# Patient Record
Sex: Male | Born: 1965 | Race: White | Hispanic: No | Marital: Married | Smoking: Never smoker
Health system: Southern US, Community
[De-identification: ages and names within clinical notes are randomized; demographics above are authoritative.]

## PROBLEM LIST (undated history)

## (undated) HISTORY — PX: TONSILLECTOMY: SUR1361

---

## 2013-03-04 ENCOUNTER — Other Ambulatory Visit: Payer: Self-pay | Admitting: Anesthesiology

## 2013-03-04 DIAGNOSIS — M545 Low back pain: Secondary | ICD-10-CM

## 2013-04-05 ENCOUNTER — Inpatient Hospital Stay: Admission: RE | Admit: 2013-04-05 | Payer: Self-pay | Source: Ambulatory Visit

## 2013-04-06 ENCOUNTER — Ambulatory Visit
Admission: RE | Admit: 2013-04-06 | Discharge: 2013-04-06 | Disposition: A | Discharge status: Home / Self Care | Payer: BC Managed Care – PPO | Source: Ambulatory Visit | Attending: Anesthesiology | Admitting: Anesthesiology

## 2013-04-06 DIAGNOSIS — M545 Low back pain: Secondary | ICD-10-CM

## 2014-01-02 ENCOUNTER — Other Ambulatory Visit: Payer: Self-pay | Admitting: Anesthesiology

## 2014-01-02 DIAGNOSIS — M542 Cervicalgia: Secondary | ICD-10-CM

## 2014-01-07 ENCOUNTER — Ambulatory Visit
Admission: RE | Admit: 2014-01-07 | Discharge: 2014-01-07 | Disposition: A | Discharge status: Home / Self Care | Payer: BC Managed Care – PPO | Source: Ambulatory Visit | Attending: Anesthesiology | Admitting: Anesthesiology

## 2014-01-07 DIAGNOSIS — M542 Cervicalgia: Secondary | ICD-10-CM

## 2017-01-03 ENCOUNTER — Other Ambulatory Visit: Payer: Self-pay | Admitting: Physical Medicine and Rehabilitation

## 2017-01-03 DIAGNOSIS — M5432 Sciatica, left side: Secondary | ICD-10-CM

## 2017-01-16 ENCOUNTER — Ambulatory Visit
Admission: RE | Admit: 2017-01-16 | Discharge: 2017-01-16 | Disposition: A | Discharge status: Home / Self Care | Payer: BLUE CROSS/BLUE SHIELD | Source: Ambulatory Visit | Attending: Physical Medicine and Rehabilitation | Admitting: Physical Medicine and Rehabilitation

## 2017-01-16 DIAGNOSIS — M5432 Sciatica, left side: Secondary | ICD-10-CM

## 2017-03-15 DIAGNOSIS — K921 Melena: Secondary | ICD-10-CM | POA: Diagnosis not present

## 2017-03-30 DIAGNOSIS — K921 Melena: Secondary | ICD-10-CM | POA: Diagnosis not present

## 2017-04-02 DIAGNOSIS — H00014 Hordeolum externum left upper eyelid: Secondary | ICD-10-CM | POA: Diagnosis not present

## 2017-06-20 DIAGNOSIS — Z0000 Encounter for general adult medical examination without abnormal findings: Secondary | ICD-10-CM | POA: Diagnosis not present

## 2017-06-20 DIAGNOSIS — E785 Hyperlipidemia, unspecified: Secondary | ICD-10-CM | POA: Diagnosis not present

## 2017-06-20 DIAGNOSIS — E1169 Type 2 diabetes mellitus with other specified complication: Secondary | ICD-10-CM | POA: Diagnosis not present

## 2017-06-20 DIAGNOSIS — Z2821 Immunization not carried out because of patient refusal: Secondary | ICD-10-CM | POA: Diagnosis not present

## 2017-06-20 DIAGNOSIS — E559 Vitamin D deficiency, unspecified: Secondary | ICD-10-CM | POA: Diagnosis not present

## 2017-07-13 IMAGING — MR MR LUMBAR SPINE W/O CM
4 of 5 series · 20 of 48 positions shown · non-contrast
Comparison: 04/06/2013

CLINICAL DATA: Chronic low back pain with worsening left-sided pain
and weakness extending to the foot over the last 2 months.

EXAM:
MRI LUMBAR SPINE WITHOUT CONTRAST
TECHNIQUE: Multiplanar, multisequence MR imaging of the lumbar spine was
performed. No intravenous contrast was administered.

[Series 6: T2 · sagittal · 4.0mm · 0.73mm/px · 6 of 15 slices shown (1 of 2)]
[im 1/15]
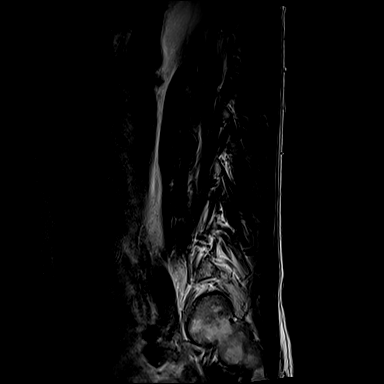
[im 3/15]
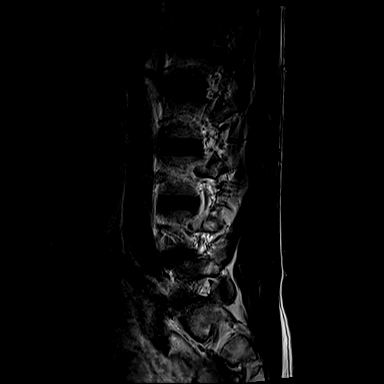
[im 6/15]
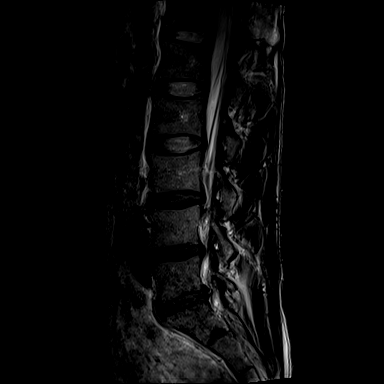
[im 9/15]
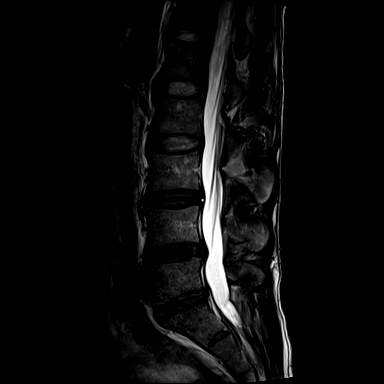
[im 12/15]
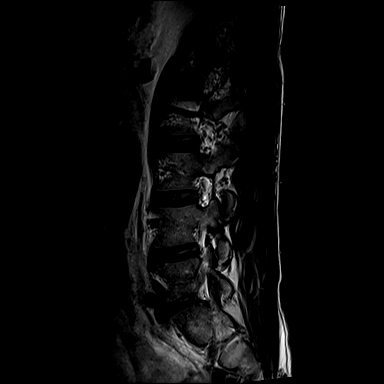
[im 15/15]
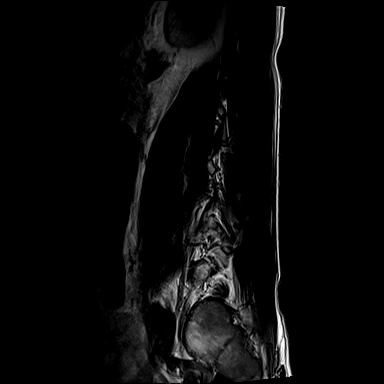

[Series 7: T1 · sagittal · 4.0mm · 0.73mm/px · 3 of 15 slices shown]
[im 3/15]
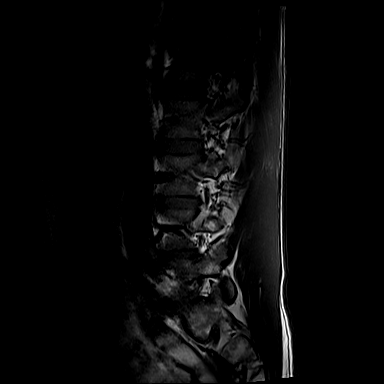
[im 9/15]
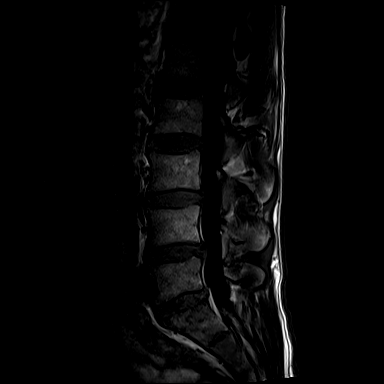
[im 15/15]
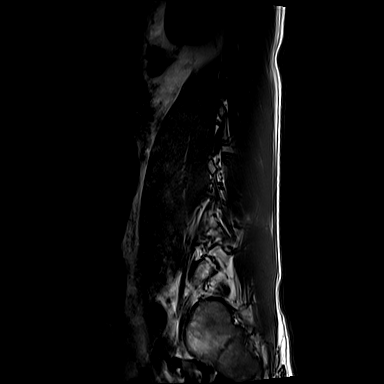

[Series 8: STIR · sagittal · 4.0mm · 0.88mm/px · 3 of 15 slices shown]
[im 3/15]
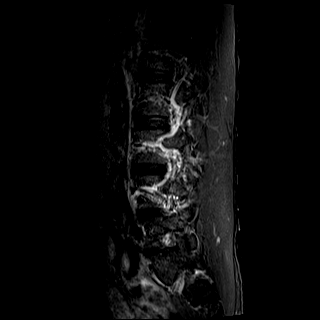
[im 9/15]
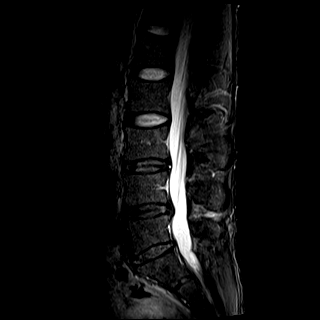
[im 15/15]
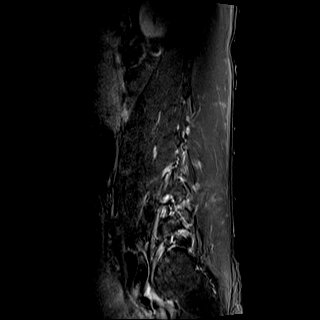

[Series 13: T2 · axial · 4.0mm · 0.28mm/px · z∈[-128,+45]mm · 8 of 38 slices shown (2 of 2)]
[im 1/38]
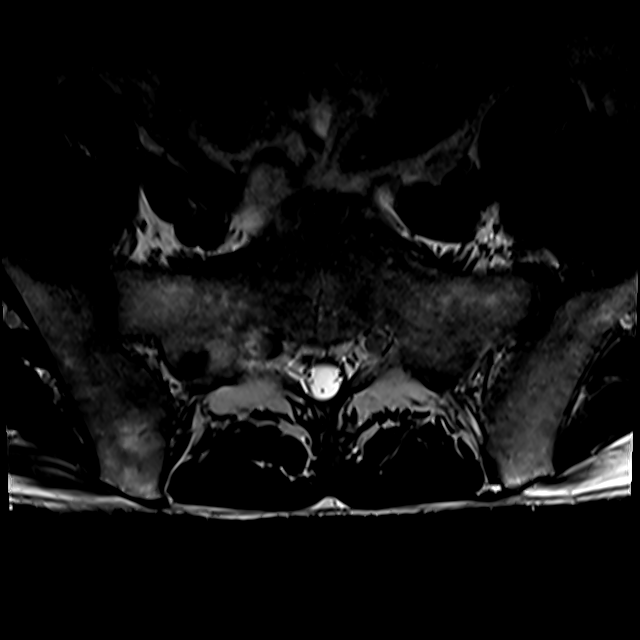
[im 6/38]
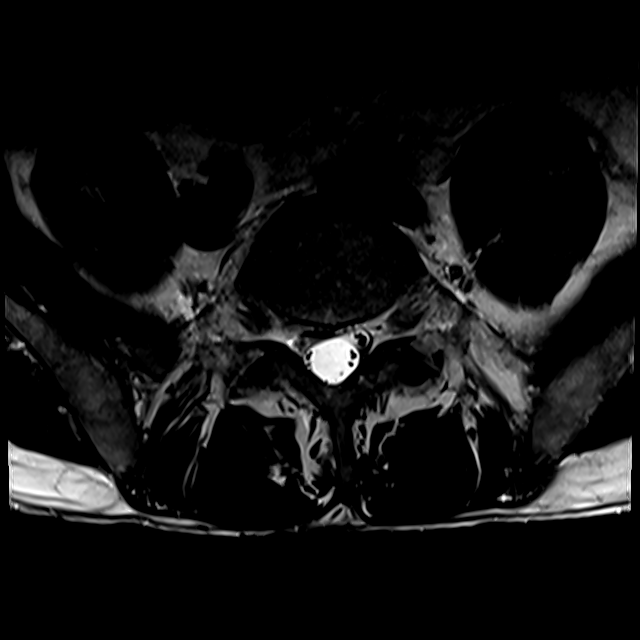
[im 11/38]
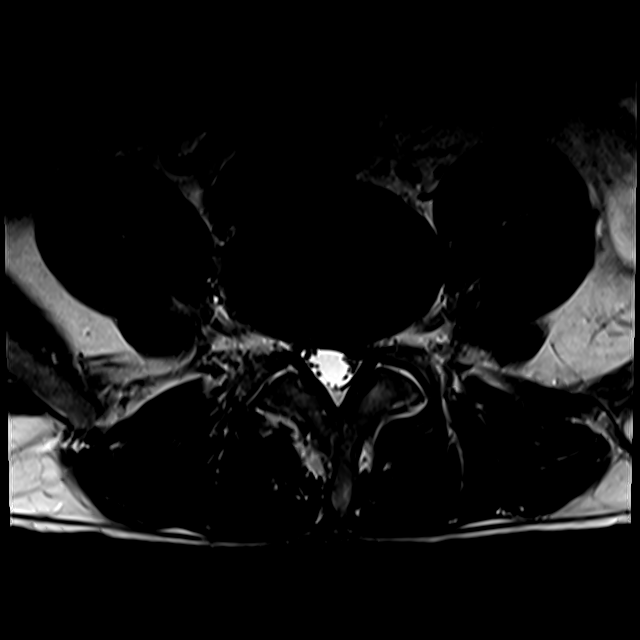
[im 16/38]
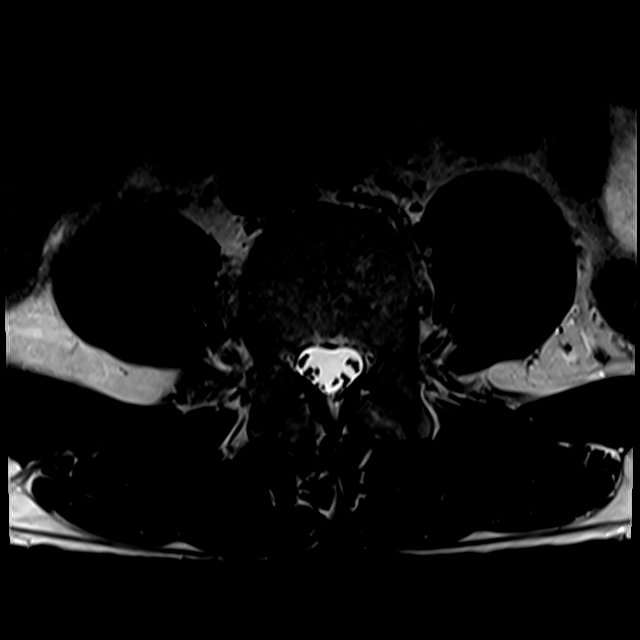
[im 19/38]
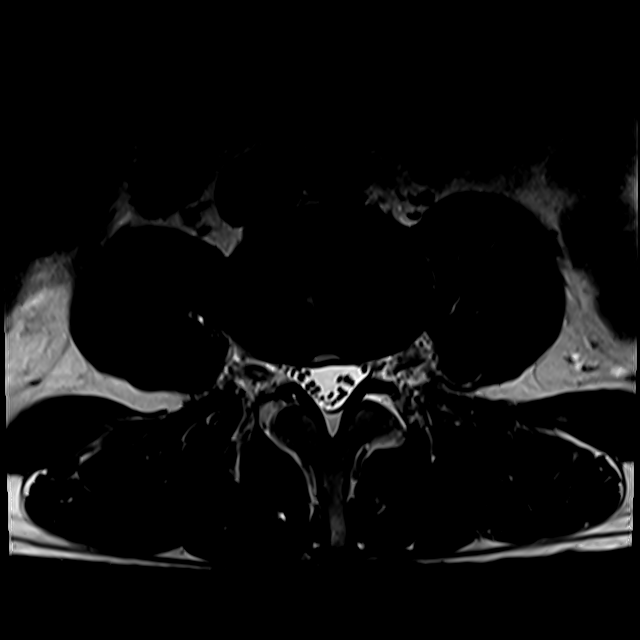
[im 22/38]
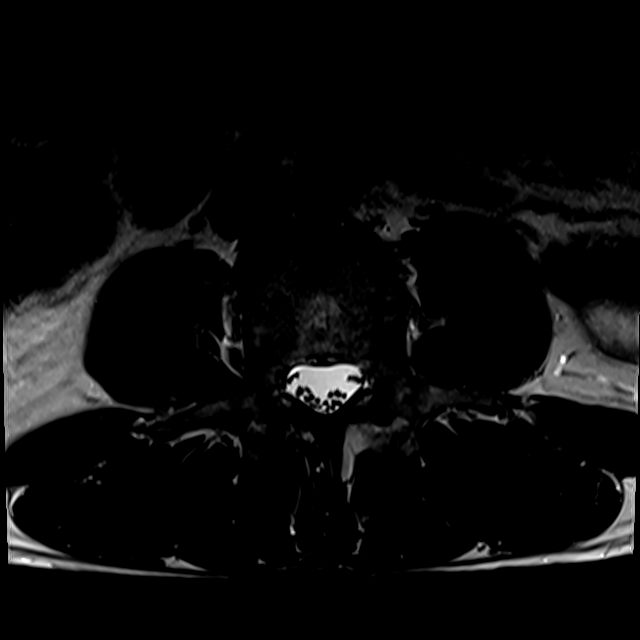
[im 27/38]
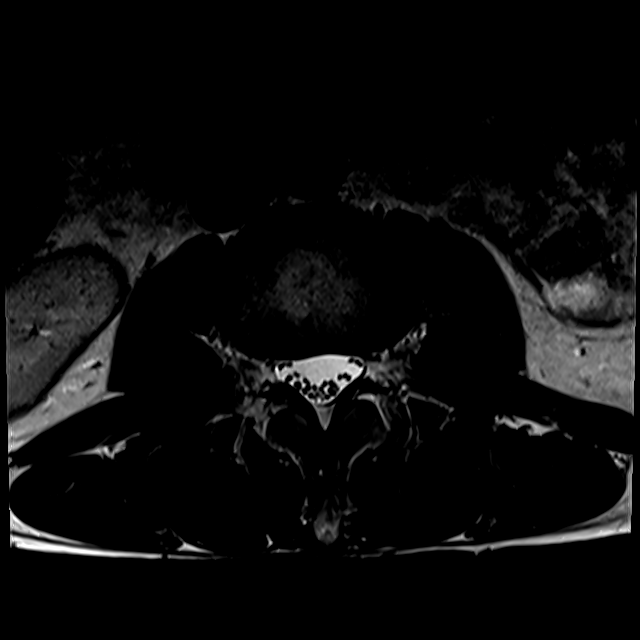
[im 32/38]
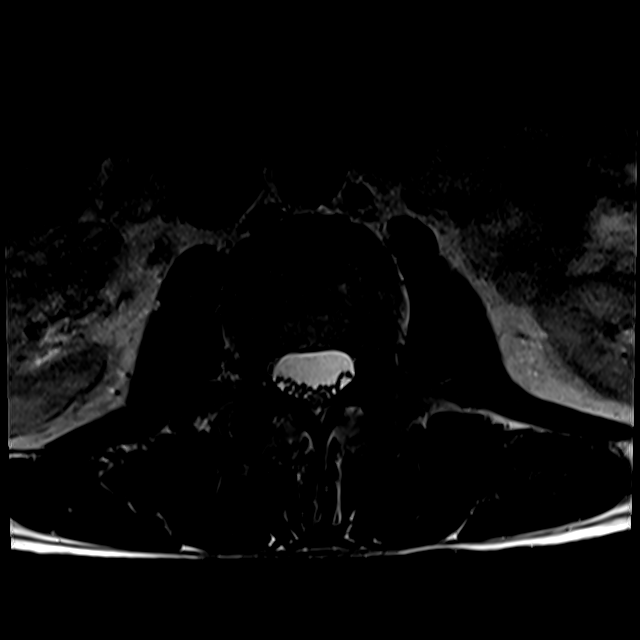

[20 of 48 positions shown; findings below may reference images not displayed]

FINDINGS: Segmentation:  5 lumbar type vertebral bodies.

Alignment:  Straightening of the normal lumbar lordosis.

Vertebrae:  No primary bone finding.

Conus medullaris: Extends to the L1 level and appears normal.

Paraspinal and other soft tissues: Normal

Disc levels:

Normal at L2-3 and above.

L3-4: Disc degeneration with annular fissures an annular bulging.
Slight indentation of the thecal sac but no neural compression.

L4-5: Disc degeneration with a broad-based, left posterolateral
predominant disc herniation. Stenosis of the lateral recesses left
more than right. In particular, left L5 nerve compression is likely.

L5-S1: Disc degeneration. Annular fissures an annular bulging more
prominent towards the right. Stenosis of the subarticular lateral
recess on the right that could affect the right S1 nerve.

Compared to the previous study, the findings at L4-5 have worsened.
Other findings are quite similar.
IMPRESSION: Newly seen left posterolateral disc herniation at L4-5 with left
lateral recess stenosis likely to compress the left L5 nerve root.

Chronic annular fissures and annular bulging at L3-4 without visible
neural compression.

L5-S1 chronic right posterolateral annular fissures and annular
bulging with narrowing of the subarticular lateral recess that could
affect the right S1 nerve. This appears quite similar to the
previous study of 0188, slightly desiccated and involuted since
then.

## 2018-08-10 NOTE — Progress Notes (Addendum)
[facility] at [facility] [address], [address] [place], [place] [address] [phone]  Date:  08/14/2018   Name:  Lawrence Cook   DOB:  03/18/1966   MRN:  [id number]  PCP:  Pearline Cables, MD    Chief Complaint: No chief complaint on file.   History of Present Illness:  Lawrence Cook is a 52 y.o. 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  They moved here from [place] for the job in 2016 and they like the weather better; his wife does miss the city however His wife is a patient here at this office He is a Psychiatrist patient but wishes to change over to this practice as it is more convenient for him  Thankfully he is generally in good health   He became vegan in 2016 and lost extra weight He is not fasting, he ate a hot cereal this am  We has been noted to have pre-diabetes, and had one a1c at 6.9% in 2/18 but did not have confirmation as of yet  He wants to make sure that he does not have diabetes so will check an A1c today   He does exercise several times a week  He enjoys running and also yoga a couple of times a week  He did have a colonoscopy last year - 03/30/17 per DR. Pleasant,he was given a 10 year follow-up   He has noted left shoulder and chest pain for several years- it is not associated with cardio exercise but may occur when he is doing weight or yoga maneuvers which stretch his chest or shoulders  He had a heart cath back in 2015 which was normal However this sx has remained until now   Negative hep C 2/18  He is originally from [place]  He is married, they have a 9yo daughter named Albin Felling in the 3rd grade   Declines a flu shot today  Tetanus shot booster to be given today  S/p tonsillectomy  Never a smoker He really does not drink alcohol except for a very rare occasion  PSA in 2018  0.5 There are no active problems to display for this patient.  History reviewed. No pertinent past medical history.  Past Surgical History:  Procedure Laterality Date  . TONSILLECTOMY      Social History   Tobacco Use  . Smoking status: Never Smoker  . Smokeless tobacco: Never Used  Substance Use Topics  . Alcohol use: Never    Frequency: Never  . Drug use: Never    History reviewed. No pertinent family history.  Allergies not on file  Medication list has been reviewed and updated.  No current outpatient medications on file prior to visit.   No current facility-administered medications on file prior to visit.     Review of Systems:  As per HPI- otherwise negative.   Physical Examination: Vitals:   08/14/18 0906  BP: 135/84  Pulse: 65  Resp: 16  Temp: 98.2 F (36.8 C)  SpO2: 100%   Vitals:   08/14/18 0906  Weight: 179 lb (81.2 kg)  Height: 6\' 2"  (1.88 m)   Body mass index is 22.98 kg/m. Ideal Body Weight: Weight in (lb) to have BMI = 25: 194.3  GEN: WDWN, NAD, Non-toxic, A & O x 3,slim build, looks well  HEENT: Atraumatic, Normocephalic. Neck  supple. No masses, No LAD.Bilateral TM wnl, oropharynx normal.  PEERL,EOMI.   Ears and Nose: No external deformity. CV: RRR, No M/G/R. No JVD. No thrill. No extra heart sounds. PULM: CTA B, no wheezes, crackles, rhonchi. No retractions. No resp. distress. No accessory muscle use. EXTR: No c/c/e NEURO Normal gait.  PSYCH: Normally interactive. Conversant. Not depressed or anxious appearing.  Calm demeanor.   EKG: NSR, no concerning findings  Assessment and Plan: Screening for deficiency anemia - Plan: CBC  Screening for diabetes mellitus - Plan: Comprehensive metabolic panel, Hemoglobin A1c  Screening for hyperlipidemia - Plan: Lipid panel  Screening for prostate cancer - Plan: PSA  Immunization due - Plan: Tdap vaccine greater than or equal to 7yo IM  Left-sided chest pain - Plan: EKG 12-Lead,  Ambulatory referral to Orthopedic Surgery, CANCELED: Ambulatory referral to Orthopedic Surgery  New patient here today to establish care he is generlally in good health labs pending as above He has noted this long term left shoulder and chest pain, even underwent a cath which was negative  It sounds like this may be MSK in origin as it is most bothersome with stretching. Will refer to ortho for a consultation   Signed Abbe AmsterdamJessica , MD  Received his labs Results for orders placed or performed in visit on 08/14/18  CBC  Result Value Ref Range   WBC 3.8 (L) 4.0 - 10.5 K/uL   RBC 4.67 4.22 - 5.81 Mil/uL   Platelets 281.0 150.0 - 400.0 K/uL   Hemoglobin 14.3 13.0 - 17.0 g/dL   HCT 16.141.8 09.639.0 - 04.552.0 %   MCV 89.3 78.0 - 100.0 fl   MCHC 34.1 30.0 - 36.0 g/dL   RDW 40.913.4 81.111.5 - 91.415.5 %  Comprehensive metabolic panel  Result Value Ref Range   Sodium 139 135 - 145 mEq/L   Potassium 4.6 3.5 - 5.1 mEq/L   Chloride 103 96 - 112 mEq/L   CO2 29 19 - 32 mEq/L   Glucose, Bld 105 (H) 70 - 99 mg/dL   BUN 13 6 - 23 mg/dL   Creatinine, Ser 7.820.84 0.40 - 1.50 mg/dL   Total Bilirubin 0.7 0.2 - 1.2 mg/dL   Alkaline Phosphatase 50 39 - 117 U/L   AST 20 0 - 37 U/L   ALT 21 0 - 53 U/L   Total Protein 7.0 6.0 - 8.3 g/dL   Albumin 5.0 3.5 - 5.2 g/dL   Calcium 95.610.1 8.4 - 21.310.5 mg/dL   GFR 086.57102.02 >84.69>60.00 mL/min  Hemoglobin A1c  Result Value Ref Range   Hgb A1c MFr Bld 5.7 4.6 - 6.5 %  Lipid panel  Result Value Ref Range   Cholesterol 198 0 - 200 mg/dL   Triglycerides 629.5114.0 0.0 - 149.0 mg/dL   HDL 28.4155.40 >32.44>39.00 mg/dL   VLDL 01.022.8 0.0 - 27.240.0 mg/dL   LDL Cholesterol 536120 (H) 0 - 99 mg/dL   Total CHOL/HDL Ratio 4    NonHDL 142.31   PSA  Result Value Ref Range   PSA 0.50 0.10 - 4.00 ng/mL   Message to pt

## 2018-08-14 ENCOUNTER — Encounter: Payer: Self-pay | Admitting: Family Medicine

## 2018-08-14 ENCOUNTER — Ambulatory Visit: Payer: BLUE CROSS/BLUE SHIELD | Admitting: Family Medicine

## 2018-08-14 VITALS — BP 135/84 | HR 65 | Temp 98.2°F | Resp 16 | Ht 74.0 in | Wt 179.0 lb

## 2018-08-14 DIAGNOSIS — Z23 Encounter for immunization: Secondary | ICD-10-CM | POA: Diagnosis not present

## 2018-08-14 DIAGNOSIS — Z130 Encounter for screening for diseases of the blood and blood-forming organs and certain disorders involving the immune mechanism: Secondary | ICD-10-CM

## 2018-08-14 DIAGNOSIS — R079 Chest pain, unspecified: Secondary | ICD-10-CM | POA: Diagnosis not present

## 2018-08-14 DIAGNOSIS — Z131 Encounter for screening for diabetes mellitus: Secondary | ICD-10-CM

## 2018-08-14 DIAGNOSIS — Z125 Encounter for screening for malignant neoplasm of prostate: Secondary | ICD-10-CM | POA: Diagnosis not present

## 2018-08-14 DIAGNOSIS — Z13220 Encounter for screening for lipoid disorders: Secondary | ICD-10-CM

## 2018-08-14 LAB — COMPREHENSIVE METABOLIC PANEL
ALT: 21 U/L (ref 0–53)
AST: 20 U/L (ref 0–37)
Albumin: 5 g/dL (ref 3.5–5.2)
Alkaline Phosphatase: 50 U/L (ref 39–117)
BUN: 13 mg/dL (ref 6–23)
CO2: 29 mEq/L (ref 19–32)
Calcium: 10.1 mg/dL (ref 8.4–10.5)
Chloride: 103 mEq/L (ref 96–112)
Creatinine, Ser: 0.84 mg/dL (ref 0.40–1.50)
GFR: 102.02 mL/min (ref >60.00)
Glucose, Bld: 105 mg/dL — ABNORMAL HIGH (ref 70–99)
Potassium: 4.6 mEq/L (ref 3.5–5.1)
Sodium: 139 mEq/L (ref 135–145)
Total Bilirubin: 0.7 mg/dL (ref 0.2–1.2)
Total Protein: 7 g/dL (ref 6.0–8.3)

## 2018-08-14 LAB — LIPID PANEL
CHOLESTEROL: 198 mg/dL (ref 0–200)
HDL: 55.4 mg/dL (ref >39.00)
LDL Cholesterol: 120 mg/dL — ABNORMAL HIGH (ref 0–99)
NonHDL: 142.31
Total CHOL/HDL Ratio: 4
Triglycerides: 114 mg/dL (ref 0.0–149.0)
VLDL: 22.8 mg/dL (ref 0.0–40.0)

## 2018-08-14 LAB — CBC
HCT: 41.8 % (ref 39.0–52.0)
Hemoglobin: 14.3 g/dL (ref 13.0–17.0)
MCHC: 34.1 g/dL (ref 30.0–36.0)
MCV: 89.3 fl (ref 78.0–100.0)
Platelets: 281 10*3/uL (ref 150.0–400.0)
RBC: 4.67 Mil/uL (ref 4.22–5.81)
RDW: 13.4 % (ref 11.5–15.5)
WBC: 3.8 10*3/uL — ABNORMAL LOW (ref 4.0–10.5)

## 2018-08-14 LAB — PSA: PSA: 0.5 ng/mL (ref 0.10–4.00)

## 2018-08-14 LAB — HEMOGLOBIN A1C: Hgb A1c MFr Bld: 5.7 % (ref 4.6–6.5)

## 2018-08-14 NOTE — Patient Instructions (Addendum)
It was very nice to meet you today- I will be in touch with your labs  You got a tetanus booster today as well-this is good for 10 years   Let's plan to visit in about 6 months for a physical/check up  Your EKG looks fine I will refer you to orthopedics to look at your shoulder and chest discomfort

## 2018-08-15 ENCOUNTER — Telehealth: Payer: Self-pay

## 2018-08-15 NOTE — Telephone Encounter (Signed)
Copied from CRM [phone]. Topic: Referral - Question >> Aug 15, 2018  8:43 AM Gaynelle AduPoole, Shalonda wrote: Reason for CRM: Joellen from [facility]  is calling to request a call back in regards to the referral sent over the patient chest pain. Please advise

## 2018-08-15 NOTE — Telephone Encounter (Signed)
Called her back- pt has undergone a cardiac cath in 2015 due to this same left sided chest and shoulder pain-  OTHER: Coronary angiogram was performed via right radial arteriotomy.  Normal coronaries with normal LVEF and wall motion. Stopped at diagnostic.  TR band to right wrist for hemostasis.   His pain now is associated with stretching motions of the left chest/ shoulder and NOT with cardio like running   We feel confident that his left shoulder and chest pain is non cardiac in origin

## 2018-08-26 DIAGNOSIS — R0789 Other chest pain: Secondary | ICD-10-CM | POA: Diagnosis not present

## 2018-08-26 DIAGNOSIS — M25512 Pain in left shoulder: Secondary | ICD-10-CM | POA: Diagnosis not present

## 2018-08-28 ENCOUNTER — Ambulatory Visit: Payer: Self-pay

## 2018-08-28 MED ORDER — OSELTAMIVIR PHOSPHATE 75 MG PO CAPS: 75.0000 mg | ORAL_CAPSULE | Freq: Every day | ORAL | 0 refills | Status: DC

## 2018-08-28 NOTE — Telephone Encounter (Signed)
Patient called and says his daughter was tested flu positive today after having fevers x 2 days. He says her pediatrician recommended for him to contact his PCP to be prescribed Tamiflu for prevention of the flu. He says he is not having any symptoms. I advised I will send this over for review from Dr. Patsy Lageropland and someone will call with her recommendation, he verbalized understanding.  Reason for Disposition . [1] Influenza EXPOSURE within last 48 hours (2 days) AND [2] NOT HIGH RISK AND [3] strongly requests antiviral medication  Answer Assessment - Initial Assessment Questions 1. TYPE of EXPOSURE: "How were you exposed?" (e.g., close contact, not a close contact)     Close contact with daughter 2. DATE of EXPOSURE: "When did the exposure occur?" (e.g., hour, days, weeks)     2 days ago developed fever, today tested positive for flu 3. PREGNANCY: "Is there any chance you are pregnant?" "When was your last menstrual period?"     N/A 4. HIGH RISK for COMPLICATIONS: "Do you have any heart or lung problems? Do you have a weakened immune system?" (e.g., CHF, COPD, asthma, HIV positive, chemotherapy, renal failure, diabetes mellitus, sickle cell anemia)     No 5. SYMPTOMS: "Do you have any symptoms?" (e.g., cough, fever, sore throat, difficulty breathing).     No  Protocols used: INFLUENZA EXPOSURE-A-AH

## 2018-08-28 NOTE — Telephone Encounter (Signed)
Called pt and advised that I will rx tamiflu for him

## 2018-11-21 DIAGNOSIS — L821 Other seborrheic keratosis: Secondary | ICD-10-CM | POA: Diagnosis not present

## 2018-11-21 DIAGNOSIS — L918 Other hypertrophic disorders of the skin: Secondary | ICD-10-CM | POA: Diagnosis not present

## 2019-02-18 NOTE — Progress Notes (Addendum)
[facility] [address], [address] [place], [place] [address] [phone] [phone]  Date:  02/20/2019   Name:  Lawrence DryerGeorge Mircea Harmsen   DOB:  05/12/1966   MRN:  [id number]  PCP:  Pearline Cablesopland, Jessica C, MD    Chief Complaint: Annual Exam   History of Present Illness:  Lawrence Cook is a 53 y.o. 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  He enjoys running and yoga for exercise, married with a 52-year-old daughter His wife is Lexicographerfinancial project manager  He is doing yoga at home right now  At her last visit he had concern of long-term left shoulder and chest pain, which seem to be related to moving his trunk.  He actually had a cardiac cath already (in 2015) which was negative  At our last visit, he had minimal leukopenia.  Can repeat this for him today He also may need HIV screening.  Colonoscopy is up-to-date Can offer Shingrix  There are no active problems to display for this patient.  No past medical history on file.  Past Surgical History:  Procedure Laterality Date  . TONSILLECTOMY      Social History   Tobacco Use  . Smoking status: Never Smoker  . Smokeless tobacco: Never Used  Substance Use Topics  . Alcohol use: Never    Frequency: Never  . Drug use: Never    No family history on file.  No Known Allergies  Medication list has been reviewed and updated.  No current outpatient medications on file prior to visit.   No current facility-administered medications on file prior to visit.     Review of Systems:  As per HPI- otherwise negative.   Physical Examination: Vitals:   02/20/19 0831  BP: 128/80  Pulse: 63   Temp: 98.4 F (36.9 C)  SpO2: 98%   Vitals:   02/20/19 0831  Weight: 180 lb 8 oz (81.9 kg)  Height: 6\' 2"  (1.88 m)   Body mass index is 23.17 kg/m. Ideal Body Weight: Weight in (lb) to have BMI = 25: 194.3  GEN: WDWN, NAD, Non-toxic, A & O x 3, normal weight, looks well  HEENT: Atraumatic, Normocephalic. Neck supple. No masses, No LAD. Ears and Nose: No external deformity. CV: RRR, No M/G/R. No JVD. No thrill. No extra heart sounds. PULM: CTA B, no wheezes, crackles, rhonchi. No retractions. No resp. distress. No accessory muscle use. ABD: S, NT, ND EXTR: No c/c/e NEURO Normal gait.  PSYCH: Normally interactive. Conversant. Not depressed or anxious appearing.  Calm demeanor.   Wt Readings from Last 3 Encounters:  02/20/19 180 lb 8 oz (81.9 kg)  08/14/18 179 lb (81.2 kg)    Assessment and Plan: Vegan diet - Plan: B12, Vitamin D (25 hydroxy), Basic metabolic panel  Screening for HIV (human immunodeficiency virus) - Plan: HIV Antibody (routine testing w rflx)  Leukopenia, unspecified type - Plan: CBC with Differential/Platelet   Follow-up: No follow-ups on file.  No orders of the defined types were placed in this encounter.  Orders Placed This Encounter  Procedures  . CBC with Differential/Platelet  . HIV Antibody (routine testing w rflx)  .  B12  . Vitamin D (25 hydroxy)  . Basic metabolic panel    @SIGN @  Outpatient Encounter Medications as of 02/20/2019  Medication Sig  . [DISCONTINUED] oseltamivir (TAMIFLU) 75 MG capsule Take 1 capsule (75 mg total) by mouth daily.   No facility-administered encounter medications on file as of 02/20/2019.     Here today for a recheck visit- doing well, continuing to exercise and stay heathy   I will be in touch with his labs He does not want to do shingrix vaccine right now- will do at a later date  Plan to visit in 6 months for a CPE   Signed Lamar Blinks, MD  Received his labs, message to pt  Results for orders  placed or performed in visit on 02/20/19  CBC with Differential/Platelet  Result Value Ref Range   WBC 3.4 (L) 4.0 - 10.5 K/uL   RBC 4.51 4.22 - 5.81 Mil/uL   Hemoglobin 13.8 13.0 - 17.0 g/dL   HCT 40.5 39.0 - 52.0 %   MCV 89.9 78.0 - 100.0 fl   MCHC 34.1 30.0 - 36.0 g/dL   RDW 13.2 11.5 - 15.5 %   Platelets 262.0 150.0 - 400.0 K/uL   Neutrophils Relative % 56.9 43.0 - 77.0 %   Lymphocytes Relative 33.0 12.0 - 46.0 %   Monocytes Relative 7.4 3.0 - 12.0 %   Eosinophils Relative 1.6 0.0 - 5.0 %   Basophils Relative 1.1 0.0 - 3.0 %   Neutro Abs 2.0 1.4 - 7.7 K/uL   Lymphs Abs 1.1 0.7 - 4.0 K/uL   Monocytes Absolute 0.3 0.1 - 1.0 K/uL   Eosinophils Absolute 0.1 0.0 - 0.7 K/uL   Basophils Absolute 0.0 0.0 - 0.1 K/uL  B12  Result Value Ref Range   Vitamin B-12 216 211 - 911 pg/mL  Vitamin D (25 hydroxy)  Result Value Ref Range   VITD 34.55 30.00 - 100.00 ng/mL  Basic metabolic panel  Result Value Ref Range   Sodium 141 135 - 145 mEq/L   Potassium 4.9 3.5 - 5.1 mEq/L   Chloride 105 96 - 112 mEq/L   CO2 31 19 - 32 mEq/L   Glucose, Bld 105 (H) 70 - 99 mg/dL   BUN 15 6 - 23 mg/dL   Creatinine, Ser 0.89 0.40 - 1.50 mg/dL   Calcium 9.7 8.4 - 10.5 mg/dL   GFR 89.61 >60.00 mL/min

## 2019-02-20 ENCOUNTER — Ambulatory Visit (INDEPENDENT_AMBULATORY_CARE_PROVIDER_SITE_OTHER): Payer: BC Managed Care – PPO | Admitting: Family Medicine

## 2019-02-20 ENCOUNTER — Encounter: Payer: Self-pay | Admitting: Family Medicine

## 2019-02-20 ENCOUNTER — Other Ambulatory Visit: Payer: Self-pay

## 2019-02-20 VITALS — BP 128/80 | HR 63 | Temp 98.4°F | Ht 74.0 in | Wt 180.5 lb

## 2019-02-20 DIAGNOSIS — Z789 Other specified health status: Secondary | ICD-10-CM

## 2019-02-20 DIAGNOSIS — D72819 Decreased white blood cell count, unspecified: Secondary | ICD-10-CM | POA: Diagnosis not present

## 2019-02-20 DIAGNOSIS — Z114 Encounter for screening for human immunodeficiency virus [HIV]: Secondary | ICD-10-CM

## 2019-02-20 LAB — CBC WITH DIFFERENTIAL/PLATELET
Basophils Absolute: 0 10*3/uL (ref 0.0–0.1)
Basophils Relative: 1.1 % (ref 0.0–3.0)
Eosinophils Absolute: 0.1 10*3/uL (ref 0.0–0.7)
Eosinophils Relative: 1.6 % (ref 0.0–5.0)
HCT: 40.5 % (ref 39.0–52.0)
Hemoglobin: 13.8 g/dL (ref 13.0–17.0)
Lymphocytes Relative: 33 % (ref 12.0–46.0)
Lymphs Abs: 1.1 10*3/uL (ref 0.7–4.0)
MCHC: 34.1 g/dL (ref 30.0–36.0)
MCV: 89.9 fl (ref 78.0–100.0)
Monocytes Absolute: 0.3 10*3/uL (ref 0.1–1.0)
Monocytes Relative: 7.4 % (ref 3.0–12.0)
Neutro Abs: 2 10*3/uL (ref 1.4–7.7)
Neutrophils Relative %: 56.9 % (ref 43.0–77.0)
Platelets: 262 10*3/uL (ref 150.0–400.0)
RBC: 4.51 Mil/uL (ref 4.22–5.81)
RDW: 13.2 % (ref 11.5–15.5)
WBC: 3.4 10*3/uL — ABNORMAL LOW (ref 4.0–10.5)

## 2019-02-20 LAB — BASIC METABOLIC PANEL
BUN: 15 mg/dL (ref 6–23)
CO2: 31 mEq/L (ref 19–32)
Calcium: 9.7 mg/dL (ref 8.4–10.5)
Chloride: 105 mEq/L (ref 96–112)
Creatinine, Ser: 0.89 mg/dL (ref 0.40–1.50)
GFR: 89.61 mL/min (ref >60.00)
Glucose, Bld: 105 mg/dL — ABNORMAL HIGH (ref 70–99)
Potassium: 4.9 mEq/L (ref 3.5–5.1)
Sodium: 141 mEq/L (ref 135–145)

## 2019-02-20 LAB — VITAMIN B12: Vitamin B-12: 216 pg/mL (ref 211–911)

## 2019-02-20 LAB — VITAMIN D 25 HYDROXY (VIT D DEFICIENCY, FRACTURES): VITD: 34.55 ng/mL (ref 30.00–100.00)

## 2019-02-20 NOTE — Patient Instructions (Signed)
It was great to see you today as always- I will be in touch with your labs asap Continue to stay healthy and active, have a great summer!  Let's visit in December for your physical We talked about the Shingrix vaccine which you can have at your convenience in the future

## 2019-02-21 LAB — HIV ANTIBODY (ROUTINE TESTING W REFLEX): HIV 1&2 Ab, 4th Generation: NONREACTIVE

## 2019-03-25 ENCOUNTER — Encounter: Payer: Self-pay | Admitting: Family Medicine

## 2019-06-18 NOTE — Progress Notes (Addendum)
[facility] [address] [place], [place] [address] [phone]  Date:  06/19/2019   Name:  Lawrence Cook   DOB:  06-15-1966   MRN:  [id number]  PCP:  Darreld Mclean, MD    Chief Complaint: Discuss Diet   History of Present Illness:  Lawrence Cook is a 53 y.o. 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  She is doing all online schooling right now, this is a bit stressful.  He enjoys exercise including running and yoga for exercise At his last visit he was noted to have minimal leukopenia with a normal differential.  I asked him to recheck in 6 months to follow-up  Metabolic profile, vitamin D, B12 were normal at last labs  He follows a vegan diet -normal his B12 D were at the lower end of the range  He is taking B12 and vit D OTC now He is also concerned that his borderline A1c might put him at risk for diabetes.  He would like to recheck his A1c today  He has noted issues with ED some of the time  He has noted this for about 2 years off and on His morning erections are less robust than they used to be  He thinks this is somewhat a constant issue, as well as mechanical issue.  His relationship with his wife is very important, he would like to do what is possible to rectify the situation Flu shot- declines today   Lab Results  Component Value Date   HGBA1C 5.7 08/14/2018    There are no active problems to display for this patient.   History reviewed. No pertinent past medical history.  Past Surgical History:  Procedure Laterality Date  . TONSILLECTOMY      Social History   Tobacco Use  . Smoking status: Never Smoker  . Smokeless tobacco: Never Used  Substance Use Topics  . Alcohol use: Never    Frequency: Never  .  Drug use: Never    History reviewed. No pertinent family history.  No Known Allergies  Medication list has been reviewed and updated.  No current outpatient medications on file prior to visit.   No current facility-administered medications on file prior to visit.     Review of Systems:  As per HPI- otherwise negative. No fever or chills    Physical Examination: Vitals:   06/19/19 1408  BP: 124/80  Pulse: 75  Temp: 98.2 F (36.8 C)  SpO2: 98%   Vitals:   06/19/19 1408  Weight: 180 lb (81.6 kg)  Height: 6\' 2"  (1.88 m)   Body mass index is 23.11 kg/m. Ideal Body Weight: Weight in (lb) to have BMI = 25: 194.3  GEN: WDWN, NAD, Non-toxic, A & O x 3, well-appearing, slim and fit build HEENT: Atraumatic, Normocephalic. Neck supple. No masses, No LAD. Ears and Nose: No external deformity. CV: RRR, No M/G/R. No JVD. No thrill. No extra heart sounds. PULM: CTA B, no wheezes, crackles, rhonchi. No retractions. No resp. distress. No accessory muscle use. EXTR: No c/c/e NEURO Normal gait.  PSYCH: Normally interactive. Conversant. Not depressed or anxious appearing.  Calm demeanor.    Assessment and Plan: Screening for diabetes mellitus - Plan: Hemoglobin A1c  Leukopenia, unspecified type - Plan: CBC with Differential/Platelet  Erectile dysfunction, unspecified erectile dysfunction type - Plan: sildenafil (REVATIO) 20 MG tablet  Following up today for a few concerns.  We will check A1c today.  Advised patient that though his A1c may be borderline, he is still doing a good job with diet and exercise.  He certainly does not need to lose any weight. Okay to take vitamin D and B12 over-the-counter Recheck mild leukopenia today  Discussed ED in detail.  Seems less likely to be low testosterone.  He would like to try a medication as needed to see it may help.  Discussed how to use Revatio and gave him a prescription. Suggested that he try between 40 and 60 mg and titrate up or  down as needed.  Max 100 mg per 24 hours Will plan further follow- up pending labs.   Signed Abbe Amsterdam, MD  Receive his labs 10/11- message to pt  Results for orders placed or performed in visit on 06/19/19  CBC with Differential/Platelet  Result Value Ref Range   WBC 3.8 (L) 4.0 - 10.5 K/uL   RBC 4.41 4.22 - 5.81 Mil/uL   Hemoglobin 13.6 13.0 - 17.0 g/dL   HCT 76.5 46.5 - 03.5 %   MCV 89.0 78.0 - 100.0 fl   MCHC 34.8 30.0 - 36.0 g/dL   RDW 46.5 68.1 - 27.5 %   Platelets 305.0 150.0 - 400.0 K/uL   Neutrophils Relative % 59.1 43.0 - 77.0 %   Lymphocytes Relative 33.0 12.0 - 46.0 %   Monocytes Relative 5.7 3.0 - 12.0 %   Eosinophils Relative 0.7 0.0 - 5.0 %   Basophils Relative 1.5 0.0 - 3.0 %   Neutro Abs 2.3 1.4 - 7.7 K/uL   Lymphs Abs 1.3 0.7 - 4.0 K/uL   Monocytes Absolute 0.2 0.1 - 1.0 K/uL   Eosinophils Absolute 0.0 0.0 - 0.7 K/uL   Basophils Absolute 0.1 0.0 - 0.1 K/uL  Hemoglobin A1c  Result Value Ref Range   Hgb A1c MFr Bld 6.0 4.6 - 6.5 %    Your A1c is again in the pre-diabetes range.  In fact it did go up a bit.  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   I would suggest that you continue your good health habits and we will monitor this every 6- 12 months  Your blood counts are quite normal except for minimally low white cell count.  Your white cells have been minimally low since our first meeting- this may be simply the way that you are made.  If you like we could have you see hematology for a consultation, but I tend to think they will not find anything wrong.  We can also continue to monitor if you feel comfortable

## 2019-06-18 NOTE — Patient Instructions (Addendum)
It was great to see you again today We will check your blood counts and your A1c today Let's have you try generic Viagra Revatio- for ED Take 20- 100 mg as needed about one hour prior to intercourse  You might start with 40- 60 mg and see how it works  Let me know if NOT effective for you  Take care JC

## 2019-06-19 ENCOUNTER — Encounter: Payer: Self-pay | Admitting: Family Medicine

## 2019-06-19 ENCOUNTER — Other Ambulatory Visit: Payer: Self-pay

## 2019-06-19 ENCOUNTER — Ambulatory Visit: Payer: BC Managed Care – PPO | Admitting: Family Medicine

## 2019-06-19 VITALS — BP 124/80 | HR 75 | Temp 98.2°F | Ht 74.0 in | Wt 180.0 lb

## 2019-06-19 DIAGNOSIS — Z131 Encounter for screening for diabetes mellitus: Secondary | ICD-10-CM

## 2019-06-19 DIAGNOSIS — N529 Male erectile dysfunction, unspecified: Secondary | ICD-10-CM

## 2019-06-19 DIAGNOSIS — D72819 Decreased white blood cell count, unspecified: Secondary | ICD-10-CM | POA: Diagnosis not present

## 2019-06-19 MED ORDER — SILDENAFIL CITRATE 20 MG PO TABS: ORAL_TABLET | ORAL | 1 refills | Status: DC

## 2019-06-20 LAB — CBC WITH DIFFERENTIAL/PLATELET
Basophils Absolute: 0.1 10*3/uL (ref 0.0–0.1)
Basophils Relative: 1.5 % (ref 0.0–3.0)
Eosinophils Absolute: 0 10*3/uL (ref 0.0–0.7)
Eosinophils Relative: 0.7 % (ref 0.0–5.0)
HCT: 39.2 % (ref 39.0–52.0)
Hemoglobin: 13.6 g/dL (ref 13.0–17.0)
Lymphocytes Relative: 33 % (ref 12.0–46.0)
Lymphs Abs: 1.3 10*3/uL (ref 0.7–4.0)
MCHC: 34.8 g/dL (ref 30.0–36.0)
MCV: 89 fl (ref 78.0–100.0)
Monocytes Absolute: 0.2 10*3/uL (ref 0.1–1.0)
Monocytes Relative: 5.7 % (ref 3.0–12.0)
Neutro Abs: 2.3 10*3/uL (ref 1.4–7.7)
Neutrophils Relative %: 59.1 % (ref 43.0–77.0)
Platelets: 305 10*3/uL (ref 150.0–400.0)
RBC: 4.41 Mil/uL (ref 4.22–5.81)
RDW: 13 % (ref 11.5–15.5)
WBC: 3.8 10*3/uL — ABNORMAL LOW (ref 4.0–10.5)

## 2019-06-20 LAB — HEMOGLOBIN A1C: Hgb A1c MFr Bld: 6 % (ref 4.6–6.5)

## 2019-06-22 ENCOUNTER — Encounter: Payer: Self-pay | Admitting: Family Medicine

## 2019-06-27 ENCOUNTER — Encounter: Payer: Self-pay | Admitting: Family Medicine

## 2019-07-12 DIAGNOSIS — H6002 Abscess of left external ear: Secondary | ICD-10-CM | POA: Diagnosis not present

## 2019-08-27 ENCOUNTER — Ambulatory Visit: Payer: BC Managed Care – PPO | Admitting: Family Medicine

## 2019-10-14 ENCOUNTER — Ambulatory Visit: Payer: BC Managed Care – PPO | Attending: Internal Medicine

## 2019-10-14 DIAGNOSIS — Z20822 Contact with and (suspected) exposure to covid-19: Secondary | ICD-10-CM | POA: Diagnosis not present

## 2019-10-15 LAB — NOVEL CORONAVIRUS, NAA: SARS-CoV-2, NAA: DETECTED — AB

## 2019-11-26 DIAGNOSIS — S43401A Unspecified sprain of right shoulder joint, initial encounter: Secondary | ICD-10-CM | POA: Diagnosis not present

## 2019-11-26 DIAGNOSIS — M25511 Pain in right shoulder: Secondary | ICD-10-CM | POA: Diagnosis not present

## 2019-12-17 ENCOUNTER — Encounter: Payer: Self-pay | Admitting: Family Medicine

## 2020-04-20 ENCOUNTER — Encounter: Payer: Self-pay | Admitting: Family Medicine

## 2020-10-07 DIAGNOSIS — H00022 Hordeolum internum right lower eyelid: Secondary | ICD-10-CM | POA: Diagnosis not present

## 2022-01-16 LAB — HM DIABETES EYE EXAM

## 2022-03-05 NOTE — Patient Instructions (Addendum)
It was good to see you again today, I will be in touch with your labs I would suggest getting the shingles vaccine series- Shingrix- at your convenience

## 2022-03-05 NOTE — Progress Notes (Signed)
[facility] [address], [address] [place], [place] [address] [phone]  Date:  03/08/2022   Name:  Lawrence Cook   DOB:  21-Apr-1966   MRN:  [id number]  PCP:  Pearline Cables, MD    Chief Complaint: Annual Exam (Concerns/ questions: he would like to have his labs fasting /Hep C Screen due/Shingrix: none yet)   History of Present Illness:  Lawrence Cook is a 56 y.o. 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   No past medical history on file.  Past Surgical History:  Procedure Laterality Date   TONSILLECTOMY      Social History   Tobacco Use   Smoking status: Never   Smokeless tobacco: Never  Vaping Use   Vaping Use: Never used  Substance Use Topics   Alcohol use: Never   Drug use: Never    No family history on file.  No Known Allergies  Medication list has been reviewed and updated.  No current outpatient medications on file prior to visit.   No current facility-administered medications on file prior to visit.    Review of Systems:  As per HPI- otherwise negative. No CP or SOB  Physical Examination: Vitals:   03/08/22 1446  BP: 110/72  Pulse:  62  Resp: 18  Temp: 98 F (36.7 C)  SpO2: 95%   Vitals:   03/08/22 1446  Weight: 183 lb 12.8 oz (83.4 kg)  Height: 6\' 2"  (1.88 m)   Body mass index is 23.6 kg/m. Ideal Body Weight: Weight in (lb) to have BMI = 25: 194.3  GEN: no acute distress.  Normal weight, tall build.  Looks well HEENT: Atraumatic, Normocephalic.  Bilateral TM wnl, oropharynx normal.  PEERL,EOMI.   Ears and Nose: No external deformity. CV: RRR, No M/G/R. No JVD. No thrill. No extra heart sounds. PULM: CTA B, no wheezes, crackles, rhonchi. No retractions. No resp. distress. No accessory muscle use. ABD: S, NT, ND, +BS. No rebound. No HSM. EXTR: No c/c/e PSYCH: Normally interactive. Conversant.    Assessment and Plan: Physical exam  Screening for prostate cancer - Plan: PSA  Screening for deficiency anemia - Plan: CBC  Screening for diabetes mellitus - Plan: Comprehensive metabolic panel, Hemoglobin A1c  Screening for hyperlipidemia - Plan: Lipid panel  Encounter for hepatitis C screening test for low risk patient - Plan: Hepatitis C antibody  Vegan diet - Plan: B12  Physical exam today.  Encouraged healthy diet and exercise routine Will plan further follow- up pending labs. Encouraged Shingrix  Signed , MD

## 2022-03-08 ENCOUNTER — Ambulatory Visit (INDEPENDENT_AMBULATORY_CARE_PROVIDER_SITE_OTHER): Payer: BC Managed Care – PPO | Admitting: Family Medicine

## 2022-03-08 VITALS — BP 110/72 | HR 62 | Temp 98.0°F | Resp 18 | Ht 74.0 in | Wt 183.8 lb

## 2022-03-08 DIAGNOSIS — Z131 Encounter for screening for diabetes mellitus: Secondary | ICD-10-CM | POA: Diagnosis not present

## 2022-03-08 DIAGNOSIS — Z1159 Encounter for screening for other viral diseases: Secondary | ICD-10-CM

## 2022-03-08 DIAGNOSIS — Z13220 Encounter for screening for lipoid disorders: Secondary | ICD-10-CM

## 2022-03-08 DIAGNOSIS — Z0000 Encounter for general adult medical examination without abnormal findings: Secondary | ICD-10-CM

## 2022-03-08 DIAGNOSIS — Z789 Other specified health status: Secondary | ICD-10-CM

## 2022-03-08 DIAGNOSIS — Z125 Encounter for screening for malignant neoplasm of prostate: Secondary | ICD-10-CM | POA: Diagnosis not present

## 2022-03-08 DIAGNOSIS — Z130 Encounter for screening for diseases of the blood and blood-forming organs and certain disorders involving the immune mechanism: Secondary | ICD-10-CM

## 2022-03-10 ENCOUNTER — Other Ambulatory Visit: Payer: BC Managed Care – PPO

## 2022-03-17 ENCOUNTER — Other Ambulatory Visit (INDEPENDENT_AMBULATORY_CARE_PROVIDER_SITE_OTHER): Payer: BC Managed Care – PPO

## 2022-03-17 DIAGNOSIS — Z1159 Encounter for screening for other viral diseases: Secondary | ICD-10-CM | POA: Diagnosis not present

## 2022-03-17 DIAGNOSIS — Z13220 Encounter for screening for lipoid disorders: Secondary | ICD-10-CM | POA: Diagnosis not present

## 2022-03-17 DIAGNOSIS — Z125 Encounter for screening for malignant neoplasm of prostate: Secondary | ICD-10-CM | POA: Diagnosis not present

## 2022-03-17 DIAGNOSIS — Z131 Encounter for screening for diabetes mellitus: Secondary | ICD-10-CM

## 2022-03-17 DIAGNOSIS — Z789 Other specified health status: Secondary | ICD-10-CM | POA: Diagnosis not present

## 2022-03-17 DIAGNOSIS — Z130 Encounter for screening for diseases of the blood and blood-forming organs and certain disorders involving the immune mechanism: Secondary | ICD-10-CM

## 2022-03-17 LAB — LIPID PANEL
Cholesterol: 184 mg/dL (ref 0–200)
HDL: 47.2 mg/dL (ref >39.00)
LDL Cholesterol: 120 mg/dL — ABNORMAL HIGH (ref 0–99)
NonHDL: 136.74
Total CHOL/HDL Ratio: 4
Triglycerides: 82 mg/dL (ref 0.0–149.0)
VLDL: 16.4 mg/dL (ref 0.0–40.0)

## 2022-03-17 LAB — COMPREHENSIVE METABOLIC PANEL
ALT: 14 U/L (ref 0–53)
AST: 17 U/L (ref 0–37)
Albumin: 4.6 g/dL (ref 3.5–5.2)
Alkaline Phosphatase: 44 U/L (ref 39–117)
BUN: 15 mg/dL (ref 6–23)
CO2: 30 mEq/L (ref 19–32)
Calcium: 9.3 mg/dL (ref 8.4–10.5)
Chloride: 104 mEq/L (ref 96–112)
Creatinine, Ser: 0.87 mg/dL (ref 0.40–1.50)
GFR: 97.14 mL/min (ref >60.00)
Glucose, Bld: 113 mg/dL — ABNORMAL HIGH (ref 70–99)
Potassium: 4.1 mEq/L (ref 3.5–5.1)
Sodium: 141 mEq/L (ref 135–145)
Total Bilirubin: 1 mg/dL (ref 0.2–1.2)
Total Protein: 6.5 g/dL (ref 6.0–8.3)

## 2022-03-17 LAB — CBC
HCT: 39.7 % (ref 39.0–52.0)
Hemoglobin: 13.6 g/dL (ref 13.0–17.0)
MCHC: 34.3 g/dL (ref 30.0–36.0)
MCV: 88.2 fl (ref 78.0–100.0)
Platelets: 253 10*3/uL (ref 150.0–400.0)
RBC: 4.5 Mil/uL (ref 4.22–5.81)
RDW: 13 % (ref 11.5–15.5)
WBC: 3.5 10*3/uL — ABNORMAL LOW (ref 4.0–10.5)

## 2022-03-17 LAB — VITAMIN B12: Vitamin B-12: 194 pg/mL — ABNORMAL LOW (ref 211–911)

## 2022-03-17 LAB — PSA: PSA: 0.4 ng/mL (ref 0.10–4.00)

## 2022-03-17 LAB — HEMOGLOBIN A1C: Hgb A1c MFr Bld: 5.9 % (ref 4.6–6.5)

## 2022-03-18 ENCOUNTER — Encounter: Payer: Self-pay | Admitting: Family Medicine

## 2022-03-20 LAB — HEPATITIS C ANTIBODY: Hepatitis C Ab: NONREACTIVE

## 2023-03-13 DIAGNOSIS — E538 Deficiency of other specified B group vitamins: Secondary | ICD-10-CM | POA: Insufficient documentation

## 2023-03-13 NOTE — Patient Instructions (Signed)
It was great to see again today, I will be in touch with your labs soon as possible Please stop by the lab and then imaging on the ground floor to schedule/do your bone density and coronary calcium We will get you set up with podiatry to check out your feet! Recommend covid, flu, shingles vaccination  

## 2023-03-13 NOTE — Progress Notes (Unsigned)
[facility] [address], [address] [place], [place] [address] [phone]  Date:  03/14/2023   Name:  Lawrence Cook   DOB:  Jan 16, 1966   MRN:  [id number]  PCP:  Pearline Cables, MD    Chief Complaint: No chief complaint on file.   History of Present Illness:  Lawrence Cook is a 57 y.o. very pleasant male patient who presents with the following:  Patient seen today for physical exam Most recent visit with myself was 1 year ago also for his physical He is generally in good health, works for [employer] as an Art gallery manager  Married, his daughter is 57 years old and will be in the eighth grade  Shingrix Update blood work today Colonoscopy completed 2018  He does follow a vegetarian/vegan diet with history of vitamin B12 deficiency Patient Active Problem List   Diagnosis Date Noted   B12 deficiency 03/13/2023    No past medical history on file.  Past Surgical History:  Procedure Laterality Date   TONSILLECTOMY      Social History   Tobacco Use   Smoking status: Never   Smokeless tobacco: Never  Vaping Use   Vaping Use: Never used  Substance Use Topics   Alcohol use: Never   Drug use: Never    No family history on file.  No Known Allergies  Medication list has been reviewed and updated.  No current outpatient medications on file prior to visit.   No current facility-administered medications on file prior to visit.    Review of Systems:  As per HPI- otherwise negative.   Physical Examination: There were no vitals filed for this visit. There were no vitals filed for this visit. There is no height or weight on file to calculate BMI. Ideal Body Weight:    GEN: no acute distress. HEENT: Atraumatic, Normocephalic.  Ears and Nose: No external deformity. CV: RRR, No M/G/R. No JVD. No thrill. No extra heart sounds. PULM: CTA B, no wheezes, crackles, rhonchi. No retractions. No resp.  distress. No accessory muscle use. ABD: S, NT, ND, +BS. No rebound. No HSM. EXTR: No c/c/e PSYCH: Normally interactive. Conversant.    Assessment and Plan: *** Physical exam today.  Encouraged healthy diet and exercise routine Will plan further follow- up pending labs.  Signed Abbe Amsterdam, MD

## 2023-03-14 ENCOUNTER — Ambulatory Visit (INDEPENDENT_AMBULATORY_CARE_PROVIDER_SITE_OTHER): Payer: BC Managed Care – PPO | Admitting: Family Medicine

## 2023-03-14 DIAGNOSIS — Z789 Other specified health status: Secondary | ICD-10-CM

## 2023-03-14 DIAGNOSIS — Z125 Encounter for screening for malignant neoplasm of prostate: Secondary | ICD-10-CM

## 2023-03-14 DIAGNOSIS — Z130 Encounter for screening for diseases of the blood and blood-forming organs and certain disorders involving the immune mechanism: Secondary | ICD-10-CM

## 2023-03-14 DIAGNOSIS — Z538 Procedure and treatment not carried out for other reasons: Secondary | ICD-10-CM

## 2023-03-14 DIAGNOSIS — Z131 Encounter for screening for diabetes mellitus: Secondary | ICD-10-CM

## 2023-03-14 DIAGNOSIS — Z13220 Encounter for screening for lipoid disorders: Secondary | ICD-10-CM

## 2023-03-14 DIAGNOSIS — Z0000 Encounter for general adult medical examination without abnormal findings: Secondary | ICD-10-CM

## 2023-03-14 DIAGNOSIS — E538 Deficiency of other specified B group vitamins: Secondary | ICD-10-CM

## 2023-03-15 NOTE — Progress Notes (Signed)
Rescheduled

## 2023-05-15 NOTE — Progress Notes (Signed)
[facility] [address] [place], [place] [address] [phone]  Date:  05/21/2023   Name:  Lawrence Cook   DOB:  19-Feb-1966   MRN:  [id number]  PCP:  Pearline Cables, MD    Chief Complaint: Annual Exam (Concerns/ questions: none/Shingrix due: none/Flu shot today: declines)   History of Present Illness:  Lawrence Cook is a 57 y.o. 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  Past Surgical History:  Procedure Laterality Date   TONSILLECTOMY      Social History   Tobacco Use   Smoking status: Never   Smokeless tobacco: Never  Vaping Use   Vaping status: Never Used  Substance Use Topics   Alcohol use: Never   Drug use: Never    No family history on file.  No Known Allergies  Medication list has been reviewed and updated.  No current outpatient medications on file prior to visit.   No current facility-administered medications on file prior to visit.    Review of Systems:  As per HPI- otherwise negative.   Physical Examination: Vitals:   05/21/23 1427  BP: 122/60  Pulse: (!) 57  Resp: 18  Temp: 98.4 F (36.9 C)  SpO2: 97%   Vitals:   05/21/23 1427  Weight: 186 lb 6.4 oz (84.6 kg)  Height:  6\' 2"  (1.88 m)   Body mass index is 23.93 kg/m. Ideal Body Weight: Weight in (lb) to have BMI = 25: 194.3  GEN: no acute distress. Slim build, looks well  HEENT: Atraumatic, Normocephalic.  Bilateral TM wnl, oropharynx normal.  PEERL,EOMI.   Ears and Nose: No external deformity. CV: RRR, No M/G/R. No JVD. No thrill. No extra heart sounds. PULM: CTA B, no wheezes, crackles, rhonchi. No retractions. No resp. distress. No accessory muscle use. ABD: S, NT, ND, +BS. No rebound. No HSM. EXTR: No c/c/e PSYCH: Normally interactive. Conversant.   Pulse Readings from Last 3 Encounters:  05/21/23 (!) 57  03/08/22 62  06/19/19 75    Assessment and Plan: Physical exam  Screening for prostate cancer - Plan: PSA  Screening for hyperlipidemia - Plan: Lipid panel  Screening for diabetes mellitus - Plan: Comprehensive metabolic panel, Hemoglobin A1c  Screening for deficiency anemia - Plan: CBC  Vegan diet - Plan: B12  Physical exam today- encouraged healthy diet and exercise routine Will plan further follow- up pending labs. Recommended age-appropriate immunizations Signed Abbe Amsterdam, MD  Received labs 9/10- message to pt  Results for orders placed or performed in visit on 05/21/23  CBC  Result Value Ref Range   WBC 5.2 4.0 - 10.5 K/uL  RBC 4.47 4.22 - 5.81 Mil/uL   Platelets 291.0 150.0 - 400.0 K/uL   Hemoglobin 13.4 13.0 - 17.0 g/dL   HCT 40.9 81.1 - 91.4 %   MCV 88.4 78.0 - 100.0 fl   MCHC 34.0 30.0 - 36.0 g/dL   RDW 78.2 95.6 - 21.3 %  Comprehensive metabolic panel  Result Value Ref Range   Sodium 141 135 - 145 mEq/L   Potassium 4.6 3.5 - 5.1 mEq/L   Chloride 103 96 - 112 mEq/L   CO2 31 19 - 32 mEq/L   Glucose, Bld 104 (H) 70 - 99 mg/dL   BUN 11 6 - 23 mg/dL   Creatinine, Ser 0.86 0.40 - 1.50 mg/dL   Total Bilirubin 0.9 0.2 - 1.2 mg/dL   Alkaline Phosphatase 48 39 - 117 U/L   AST 18 0 - 37 U/L   ALT 16 0 - 53 U/L   Total Protein 6.8 6.0 - 8.3 g/dL   Albumin  4.5 3.5 - 5.2 g/dL   GFR 57.84 >69.62 mL/min   Calcium 9.9 8.4 - 10.5 mg/dL  Hemoglobin X5M  Result Value Ref Range   Hgb A1c MFr Bld 6.2 4.6 - 6.5 %  Lipid panel  Result Value Ref Range   Cholesterol 191 0 - 200 mg/dL   Triglycerides 841.3 0.0 - 149.0 mg/dL   HDL 24.40 >10.27 mg/dL   VLDL 25.3 0.0 - 66.4 mg/dL   LDL Cholesterol 403 (H) 0 - 99 mg/dL   Total CHOL/HDL Ratio 4    NonHDL 143.57   PSA  Result Value Ref Range   PSA 0.51 0.10 - 4.00 ng/mL  B12  Result Value Ref Range   Vitamin B-12 225 211 - 911 pg/mL

## 2023-05-15 NOTE — Patient Instructions (Addendum)
Good to see you again today- I will be in touch with your labs asap   Recommend shingles vaccine series- Shingrix- at your convenience, and a covid booster and seasonal flu shot this fall  Take care!

## 2023-05-21 ENCOUNTER — Ambulatory Visit (INDEPENDENT_AMBULATORY_CARE_PROVIDER_SITE_OTHER): Payer: BC Managed Care – PPO | Admitting: Family Medicine

## 2023-05-21 VITALS — BP 122/60 | HR 57 | Temp 98.4°F | Resp 18 | Ht 74.0 in | Wt 186.4 lb

## 2023-05-21 DIAGNOSIS — Z789 Other specified health status: Secondary | ICD-10-CM | POA: Diagnosis not present

## 2023-05-21 DIAGNOSIS — Z0000 Encounter for general adult medical examination without abnormal findings: Secondary | ICD-10-CM

## 2023-05-21 DIAGNOSIS — Z131 Encounter for screening for diabetes mellitus: Secondary | ICD-10-CM

## 2023-05-21 DIAGNOSIS — Z130 Encounter for screening for diseases of the blood and blood-forming organs and certain disorders involving the immune mechanism: Secondary | ICD-10-CM | POA: Diagnosis not present

## 2023-05-21 DIAGNOSIS — Z13220 Encounter for screening for lipoid disorders: Secondary | ICD-10-CM

## 2023-05-21 DIAGNOSIS — Z125 Encounter for screening for malignant neoplasm of prostate: Secondary | ICD-10-CM

## 2023-05-22 ENCOUNTER — Encounter: Payer: Self-pay | Admitting: Family Medicine

## 2023-05-22 DIAGNOSIS — I8390 Asymptomatic varicose veins of unspecified lower extremity: Secondary | ICD-10-CM

## 2023-05-22 LAB — LIPID PANEL
Cholesterol: 191 mg/dL (ref 0–200)
HDL: 47.2 mg/dL (ref >39.00)
LDL Cholesterol: 118 mg/dL — ABNORMAL HIGH (ref 0–99)
NonHDL: 143.57
Total CHOL/HDL Ratio: 4
Triglycerides: 127 mg/dL (ref 0.0–149.0)
VLDL: 25.4 mg/dL (ref 0.0–40.0)

## 2023-05-22 LAB — COMPREHENSIVE METABOLIC PANEL
ALT: 16 U/L (ref 0–53)
AST: 18 U/L (ref 0–37)
Albumin: 4.5 g/dL (ref 3.5–5.2)
Alkaline Phosphatase: 48 U/L (ref 39–117)
BUN: 11 mg/dL (ref 6–23)
CO2: 31 meq/L (ref 19–32)
Calcium: 9.9 mg/dL (ref 8.4–10.5)
Chloride: 103 meq/L (ref 96–112)
Creatinine, Ser: 0.85 mg/dL (ref 0.40–1.50)
GFR: 97.02 mL/min (ref >60.00)
Glucose, Bld: 104 mg/dL — ABNORMAL HIGH (ref 70–99)
Potassium: 4.6 meq/L (ref 3.5–5.1)
Sodium: 141 meq/L (ref 135–145)
Total Bilirubin: 0.9 mg/dL (ref 0.2–1.2)
Total Protein: 6.8 g/dL (ref 6.0–8.3)

## 2023-05-22 LAB — CBC
HCT: 39.5 % (ref 39.0–52.0)
Hemoglobin: 13.4 g/dL (ref 13.0–17.0)
MCHC: 34 g/dL (ref 30.0–36.0)
MCV: 88.4 fl (ref 78.0–100.0)
Platelets: 291 10*3/uL (ref 150.0–400.0)
RBC: 4.47 Mil/uL (ref 4.22–5.81)
RDW: 13.1 % (ref 11.5–15.5)
WBC: 5.2 10*3/uL (ref 4.0–10.5)

## 2023-05-22 LAB — HEMOGLOBIN A1C: Hgb A1c MFr Bld: 6.2 % (ref 4.6–6.5)

## 2023-05-22 LAB — PSA: PSA: 0.51 ng/mL (ref 0.10–4.00)

## 2023-05-22 LAB — VITAMIN B12: Vitamin B-12: 225 pg/mL (ref 211–911)

## 2023-07-16 ENCOUNTER — Other Ambulatory Visit: Payer: Self-pay | Admitting: *Deleted

## 2023-07-16 DIAGNOSIS — I8393 Asymptomatic varicose veins of bilateral lower extremities: Secondary | ICD-10-CM

## 2023-07-20 ENCOUNTER — Ambulatory Visit (HOSPITAL_COMMUNITY)
Admission: RE | Admit: 2023-07-20 | Discharge: 2023-07-20 | Disposition: A | Discharge status: Home / Self Care | Payer: BC Managed Care – PPO | Source: Ambulatory Visit | Attending: Vascular Surgery | Admitting: Vascular Surgery

## 2023-07-20 ENCOUNTER — Encounter: Payer: BC Managed Care – PPO | Admitting: Vascular Surgery

## 2023-07-20 DIAGNOSIS — I8393 Asymptomatic varicose veins of bilateral lower extremities: Secondary | ICD-10-CM | POA: Insufficient documentation

## 2023-08-02 NOTE — Progress Notes (Signed)
Patient ID: Lawrence Cook, male   DOB: 07-Mar-1966, 57 y.o.   MRN: [id number]  Reason for Consult: New Patient (Initial Visit)   Referred by Copland, Gwenlyn Found, MD  Subjective:     HPI  Lawrence Cook is a 57 y.o. 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 No pertinent past medical history. History reviewed. No pertinent family history. Past Surgical History:  Procedure Laterality Date   TONSILLECTOMY      Short Social History:  Social History   Tobacco Use   Smoking status: Never   Smokeless tobacco: Never  Substance Use Topics   Alcohol use: Never    No Known Allergies  No current outpatient medications on file.   No current facility-administered medications for this visit.    REVIEW OF SYSTEMS  Negative other than noted in HPI     Objective:  Objective   Vitals:   08/03/23 1153  BP: 137/85  Pulse: 64  Resp: 20  Temp: 98.3 F (36.8 C)  SpO2: 97%  Weight: 193 lb (87.5 kg)  Height: 6\' 2"  (1.88 m)   Body mass index is 24.78 kg/m.  Physical Exam General: no acute distress Cardiac: hemodynamically stable Pulm: normal work of breathing Neuro: alert, no focal deficit Extremities: A few small varicosities noted on the anterior right shin and medial right thigh.  No edema, no wounds. Vascular:   Right: palpable PT  Left: palpable PT   Data: Reflux study +--------------+---------+------+-----------+------------+--------+  RIGHT        Reflux NoRefluxReflux TimeDiameter cmsComments                          Yes                                   +--------------+---------+------+-----------+------------+--------+  CFV                    yes   >1 second                        +--------------+---------+------+-----------+------------+--------+  FV mid        no                                              +--------------+---------+------+-----------+------------+--------+  Popliteal    no                                              +--------------+---------+------+-----------+------------+--------+  GSV at [facility]    no                            0.78              +--------------+---------+------+-----------+------------+--------+  GSV prox thighno                            0.55              +--------------+---------+------+-----------+------------+--------+  GSV mid thigh           yes    >500 ms      0.34              +--------------+---------+------+-----------+------------+--------+  GSV dist thighno                            0.29              +--------------+---------+------+-----------+------------+--------+  GSV at knee   no                            0.31              +--------------+---------+------+-----------+------------+--------+  SSV Pop Fossa no                            0.30              +--------------+---------+------+-----------+------------+--------+       Assessment/Plan:     Lawrence Cook is a 57 y.o. male with chronic venous insufficiency with C2 disease and reflux noted in right mid GSV I explained the foundation of CVI treatment of compression and elevation I recommended medical grade graduated compression stockings and intermittent leg elevation We also discussed that many patients find symptom improvement with exercise and if they have access to a pool should attempt water aerobics.  Follow up PRN     Daria Pastures MD [facility]

## 2023-08-03 ENCOUNTER — Ambulatory Visit: Payer: BC Managed Care – PPO | Admitting: Vascular Surgery

## 2023-08-03 ENCOUNTER — Encounter: Payer: Self-pay | Admitting: Vascular Surgery

## 2023-08-03 VITALS — BP 137/85 | HR 64 | Temp 98.3°F | Resp 20 | Ht 74.0 in | Wt 193.0 lb

## 2023-08-03 DIAGNOSIS — I872 Venous insufficiency (chronic) (peripheral): Secondary | ICD-10-CM | POA: Diagnosis not present

## 2023-08-30 DIAGNOSIS — L718 Other rosacea: Secondary | ICD-10-CM | POA: Diagnosis not present

## 2023-11-27 ENCOUNTER — Encounter: Payer: Self-pay | Admitting: Family Medicine

## 2024-01-02 DIAGNOSIS — M7741 Metatarsalgia, right foot: Secondary | ICD-10-CM | POA: Diagnosis not present

## 2024-01-02 DIAGNOSIS — M5126 Other intervertebral disc displacement, lumbar region: Secondary | ICD-10-CM | POA: Diagnosis not present

## 2024-01-02 DIAGNOSIS — M7742 Metatarsalgia, left foot: Secondary | ICD-10-CM | POA: Diagnosis not present

## 2024-01-02 DIAGNOSIS — M47816 Spondylosis without myelopathy or radiculopathy, lumbar region: Secondary | ICD-10-CM | POA: Diagnosis not present

## 2024-05-23 NOTE — Patient Instructions (Addendum)
 It was great to see you today, I will be in touch with your labs to soon as possible Recommend seasonal flu shot, covid booster if you like, and the shingles vaccine series at your convenience   Please schedule a lab visit at your convenience- this Friday!  Keep up the good work with diet and exercise

## 2024-05-23 NOTE — Progress Notes (Signed)
  [facility] [address] [place], [place] [address] [phone]  Date:  05/26/2024   Name:  Lawrence Cook   DOB:  1966-05-13   MRN:  [id number]  PCP:  Lawrence Harlene BROCKS, MD    Chief Complaint: Annual Exam (Pt states he is not fasting and would like to schedule labs )   History of Present Illness:  Lawrence Cook is a 58 y.o. very pleasant male patient who presents with the following:  Patient seen today for physical exam.  I saw him most recently 1 year ago Generally in good health B12 deficiency history, he is taking a supplement.  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  Past Surgical History:  Procedure Laterality Date   TONSILLECTOMY      Social History   Tobacco Use   Smoking status: Never   Smokeless tobacco: Never  Vaping Use   Vaping status: Never Used  Substance Use Topics   Alcohol use: Never   Drug use: Never    No family history on file.  No Known Allergies  Medication list has been reviewed and updated.  No current outpatient medications on file prior to visit.   No current facility-administered medications on file prior to visit.    Review of Systems:  As per HPI- otherwise negative.   Physical Examination: Vitals:    05/26/24 1058  BP: 132/84  Pulse: (!) 56  Temp: 97.8 F (36.6 C)  SpO2: 99%   Vitals:   05/26/24 1058  Weight: 192 lb (87.1 kg)  Height: 6' 2 (1.88 m)   Body mass index is 24.65 kg/m. Ideal Body Weight: Weight in (lb) to have BMI = 25: 194.3  GEN: no acute distress. Normal weight, looks well  HEENT: Atraumatic, Normocephalic.  Bilateral TM wnl, oropharynx normal.  PEERL,EOMI.   Ears and Nose: No external deformity. CV: RRR, No M/G/R. No JVD. No thrill. No extra heart sounds. PULM: CTA B, no wheezes, crackles, rhonchi. No retractions. No resp. distress. No accessory muscle use. ABD: S, NT, ND, +BS. No rebound. No HSM. EXTR: No c/c/e PSYCH: Normally interactive. Conversant.    Assessment and Plan: Physical exam  Screening for prostate cancer - Plan: PSA  Screening for deficiency anemia - Plan: CBC  Screening for diabetes mellitus - Plan: Comprehensive metabolic panel with GFR, Hemoglobin A1c  Screening for hyperlipidemia - Plan: Lipid panel  Vegan diet - Plan: Vitamin B12  CPE today- encourage healthy diet and exercise routine Recommend flu, covid, shingles series He will come in for labs later this week- would like to have fasting BW Signed Harlene Watt, MD

## 2024-05-26 ENCOUNTER — Ambulatory Visit (INDEPENDENT_AMBULATORY_CARE_PROVIDER_SITE_OTHER): Payer: BC Managed Care – PPO | Admitting: Family Medicine

## 2024-05-26 ENCOUNTER — Encounter: Payer: Self-pay | Admitting: Family Medicine

## 2024-05-26 VITALS — BP 132/84 | HR 56 | Temp 97.8°F | Ht 74.0 in | Wt 192.0 lb

## 2024-05-26 DIAGNOSIS — Z789 Other specified health status: Secondary | ICD-10-CM

## 2024-05-26 DIAGNOSIS — Z125 Encounter for screening for malignant neoplasm of prostate: Secondary | ICD-10-CM

## 2024-05-26 DIAGNOSIS — Z0000 Encounter for general adult medical examination without abnormal findings: Secondary | ICD-10-CM

## 2024-05-26 DIAGNOSIS — Z131 Encounter for screening for diabetes mellitus: Secondary | ICD-10-CM

## 2024-05-26 DIAGNOSIS — Z13220 Encounter for screening for lipoid disorders: Secondary | ICD-10-CM

## 2024-05-26 DIAGNOSIS — Z130 Encounter for screening for diseases of the blood and blood-forming organs and certain disorders involving the immune mechanism: Secondary | ICD-10-CM

## 2024-05-30 ENCOUNTER — Encounter: Payer: Self-pay | Admitting: Family Medicine

## 2024-05-30 ENCOUNTER — Other Ambulatory Visit (INDEPENDENT_AMBULATORY_CARE_PROVIDER_SITE_OTHER)

## 2024-05-30 DIAGNOSIS — Z125 Encounter for screening for malignant neoplasm of prostate: Secondary | ICD-10-CM | POA: Diagnosis not present

## 2024-05-30 DIAGNOSIS — Z130 Encounter for screening for diseases of the blood and blood-forming organs and certain disorders involving the immune mechanism: Secondary | ICD-10-CM | POA: Diagnosis not present

## 2024-05-30 DIAGNOSIS — Z789 Other specified health status: Secondary | ICD-10-CM | POA: Diagnosis not present

## 2024-05-30 DIAGNOSIS — Z13220 Encounter for screening for lipoid disorders: Secondary | ICD-10-CM

## 2024-05-30 DIAGNOSIS — Z131 Encounter for screening for diabetes mellitus: Secondary | ICD-10-CM | POA: Diagnosis not present

## 2024-05-30 LAB — CBC
HCT: 39.7 % (ref 39.0–52.0)
Hemoglobin: 13.6 g/dL (ref 13.0–17.0)
MCHC: 34.1 g/dL (ref 30.0–36.0)
MCV: 87.6 fl (ref 78.0–100.0)
Platelets: 269 K/uL (ref 150.0–400.0)
RBC: 4.53 Mil/uL (ref 4.22–5.81)
RDW: 13.5 % (ref 11.5–15.5)
WBC: 4 K/uL (ref 4.0–10.5)

## 2024-05-30 LAB — LIPID PANEL
Cholesterol: 192 mg/dL (ref 0–200)
HDL: 37.7 mg/dL — ABNORMAL LOW (ref >39.00)
LDL Cholesterol: 128 mg/dL — ABNORMAL HIGH (ref 0–99)
NonHDL: 154.12
Total CHOL/HDL Ratio: 5
Triglycerides: 129 mg/dL (ref 0.0–149.0)
VLDL: 25.8 mg/dL (ref 0.0–40.0)

## 2024-05-30 LAB — COMPREHENSIVE METABOLIC PANEL WITH GFR
ALT: 17 U/L (ref 0–53)
AST: 18 U/L (ref 0–37)
Albumin: 4.4 g/dL (ref 3.5–5.2)
Alkaline Phosphatase: 51 U/L (ref 39–117)
BUN: 12 mg/dL (ref 6–23)
CO2: 32 meq/L (ref 19–32)
Calcium: 9.7 mg/dL (ref 8.4–10.5)
Chloride: 105 meq/L (ref 96–112)
Creatinine, Ser: 0.94 mg/dL (ref 0.40–1.50)
GFR: 89.86 mL/min (ref >60.00)
Glucose, Bld: 111 mg/dL — ABNORMAL HIGH (ref 70–99)
Potassium: 4.5 meq/L (ref 3.5–5.1)
Sodium: 142 meq/L (ref 135–145)
Total Bilirubin: 0.9 mg/dL (ref 0.2–1.2)
Total Protein: 6.2 g/dL (ref 6.0–8.3)

## 2024-05-30 LAB — HEMOGLOBIN A1C: Hgb A1c MFr Bld: 6.6 % — ABNORMAL HIGH (ref 4.6–6.5)

## 2024-05-30 LAB — PSA: PSA: 0.55 ng/mL (ref 0.10–4.00)

## 2024-05-30 LAB — VITAMIN B12: Vitamin B-12: 366 pg/mL (ref 211–911)
